# Patient Record
Sex: Male | Born: 2000 | Race: Black or African American | Hispanic: No | Marital: Single
Health system: Southern US, Community
[De-identification: ages and names within clinical notes are randomized; demographics above are authoritative.]

## PROBLEM LIST (undated history)

## (undated) DIAGNOSIS — J302 Other seasonal allergic rhinitis: Secondary | ICD-10-CM

---
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
## 2022-02-20 ENCOUNTER — Other Ambulatory Visit: Payer: Self-pay

## 2022-02-20 ENCOUNTER — Emergency Department (HOSPITAL_COMMUNITY): Payer: Self-pay

## 2022-02-20 ENCOUNTER — Emergency Department (HOSPITAL_COMMUNITY)
Admission: EM | Admit: 2022-02-20 | Discharge: 2022-02-20 | Disposition: A | Discharge status: Home / Self Care | Payer: Self-pay | Attending: Emergency Medicine | Admitting: Emergency Medicine

## 2022-02-20 DIAGNOSIS — N200 Calculus of kidney: Secondary | ICD-10-CM | POA: Insufficient documentation

## 2022-02-20 DIAGNOSIS — R944 Abnormal results of kidney function studies: Secondary | ICD-10-CM | POA: Insufficient documentation

## 2022-02-20 LAB — COMPREHENSIVE METABOLIC PANEL
ALT: 20 U/L (ref 0–44)
AST: 38 U/L (ref 15–41)
Albumin: 4.9 g/dL (ref 3.5–5.0)
Alkaline Phosphatase: 44 U/L (ref 38–126)
Anion gap: 13 (ref 5–15)
BUN: 14 mg/dL (ref 6–20)
CO2: 22 mmol/L (ref 22–32)
Calcium: 9.8 mg/dL (ref 8.9–10.3)
Chloride: 104 mmol/L (ref 98–111)
Creatinine, Ser: 1.46 mg/dL — ABNORMAL HIGH (ref 0.61–1.24)
GFR, Estimated: >60 mL/min (ref >60)
Glucose, Bld: 119 mg/dL — ABNORMAL HIGH (ref 70–99)
Potassium: 4.7 mmol/L (ref 3.5–5.1)
Sodium: 139 mmol/L (ref 135–145)
Total Bilirubin: 1 mg/dL (ref 0.3–1.2)
Total Protein: 8.2 g/dL — ABNORMAL HIGH (ref 6.5–8.1)

## 2022-02-20 LAB — CBC WITH DIFFERENTIAL/PLATELET
Abs Immature Granulocytes: 0.03 10*3/uL (ref 0.00–0.07)
Basophils Absolute: 0 10*3/uL (ref 0.0–0.1)
Basophils Relative: 0 %
Eosinophils Absolute: 0 10*3/uL (ref 0.0–0.5)
Eosinophils Relative: 0 %
HCT: 44 % (ref 39.0–52.0)
Hemoglobin: 14.8 g/dL (ref 13.0–17.0)
Immature Granulocytes: 0 %
Lymphocytes Relative: 8 %
Lymphs Abs: 0.6 10*3/uL — ABNORMAL LOW (ref 0.7–4.0)
MCH: 30.6 pg (ref 26.0–34.0)
MCHC: 33.6 g/dL (ref 30.0–36.0)
MCV: 90.9 fL (ref 80.0–100.0)
Monocytes Absolute: 0.6 10*3/uL (ref 0.1–1.0)
Monocytes Relative: 8 %
Neutro Abs: 6.6 10*3/uL (ref 1.7–7.7)
Neutrophils Relative %: 84 %
Platelets: 129 10*3/uL — ABNORMAL LOW (ref 150–400)
RBC: 4.84 MIL/uL (ref 4.22–5.81)
RDW: 12.8 % (ref 11.5–15.5)
WBC: 7.9 10*3/uL (ref 4.0–10.5)
nRBC: 0 % (ref 0.0–0.2)

## 2022-02-20 LAB — LIPASE, BLOOD: Lipase: 26 U/L (ref 11–51)

## 2022-02-20 MED ORDER — IOHEXOL 350 MG/ML SOLN
75.0000 mL | Freq: Once | INTRAVENOUS | Status: AC | PRN
  Administered 2022-02-20: 75 mL via INTRAVENOUS

## 2022-02-20 MED ORDER — TAMSULOSIN HCL 0.4 MG PO CAPS: 0.4000 mg | ORAL_CAPSULE | Freq: Every day | ORAL | 0 refills | Status: AC

## 2022-02-20 MED ORDER — SODIUM CHLORIDE 0.9 % IV BOLUS
1000.0000 mL | Freq: Once | INTRAVENOUS | Status: AC
  Administered 2022-02-20: 1000 mL via INTRAVENOUS

## 2022-02-20 MED ORDER — ONDANSETRON 4 MG PO TBDP
4.0000 mg | ORAL_TABLET | Freq: Once | ORAL | Status: AC
  Administered 2022-02-20: 4 mg via ORAL
  Filled 2022-02-20: qty 1

## 2022-02-20 MED ORDER — OXYCODONE-ACETAMINOPHEN 5-325 MG PO TABS
2.0000 | ORAL_TABLET | Freq: Once | ORAL | Status: AC
  Administered 2022-02-20: 2 via ORAL
  Filled 2022-02-20: qty 2

## 2022-02-20 NOTE — ED Triage Notes (Signed)
Pt. Stated, I have appendicitis with right side pain and N/V started this morning around 0400

## 2022-02-20 NOTE — ED Provider Notes (Signed)
[facility] EMERGENCY DEPARTMENT Provider Note   CSN: [id number] Arrival date & time: 02/20/22  1040     History  Chief Complaint  Patient presents with   Abdominal Pain   Nausea   Emesis    Robert Cochran is a 21 y.o. male with medical history of seasonal allergies.  Patient presents to ED for evaluation of right lower quadrant abdominal pain.  Patient states that last night he went to bed pain-free.  Patient reports that around 4:45 AM or 5 AM he woke up with right lower quadrant pain rated at 8 out of 10.  Patient reports that initially pain was located in his groin as well but has recently located to his right lower quadrant.  Patient states the pain feels sharp. Patient states that since this time, the right lower quadrant pain progressively worsened at 1 urgent care was 11 out of 10.  On examination currently the patient denies any pain stating that it is decreased.    Patient states he was at urgent care who advised him to present to ED for evaluation, concern for appendicitis.  The patient is endorsing nausea and vomiting this morning however denies any fevers or diarrhea.  Patient denies any urinary symptoms.  The patient states that 2 weeks ago he was hit in the genitals by his dog however the pain from this resolved after about 1 day.   Abdominal Pain Associated symptoms: nausea and vomiting   Associated symptoms: no diarrhea, no dysuria and no fever   Emesis Associated symptoms: abdominal pain   Associated symptoms: no diarrhea and no fever        Home Medications Prior to Admission medications   Medication Sig Start Date End Date Taking? Authorizing Provider  tamsulosin (FLOMAX) 0.4 MG CAPS capsule Take 1 capsule (0.4 mg total) by mouth daily after breakfast. 02/20/22  Yes Azucena Cecil, PA-C  cetirizine (ZYRTEC) 10 MG chewable tablet Chew 1 tablet (10 mg total) by mouth daily. 01/05/14   Melony Overly, MD  doxycycline (VIBRAMYCIN) 100 MG  capsule Take 1 capsule (100 mg total) by mouth 2 (two) times daily. Take as prescribed until finished 11/15/16   Antonietta Breach, PA-C  fluticasone [facility]) 50 MCG/ACT nasal spray Place 2 sprays into both nostrils 2 (two) times daily. Decrease to 2 sprays/nostril daily after 5 days 07/24/13   Liam Graham, PA-C  ibuprofen (ADVIL,MOTRIN) 600 MG tablet Take 1 tablet (600 mg total) by mouth every 6 (six) hours as needed for mild pain or moderate pain. 01/25/17   Kristen Cardinal, NP      Allergies    Penicillins    Review of Systems   Review of Systems  Constitutional:  Negative for fever.  Gastrointestinal:  Positive for abdominal pain, nausea and vomiting. Negative for diarrhea.  Genitourinary:  Positive for flank pain. Negative for dysuria.  All other systems reviewed and are negative.   Physical Exam Updated Vital Signs BP (!) 139/91   Pulse 70   Temp 97.8 F (36.6 C) (Oral)   Resp (!) 21   Ht 6\' 4"  (1.93 m)   Wt 74.8 kg   SpO2 100%   BMI 20.08 kg/m  Physical Exam Vitals and nursing note reviewed.  Constitutional:      General: He is not in acute distress.    Appearance: He is well-developed.  HENT:     Head: Normocephalic and atraumatic.  Eyes:     Conjunctiva/sclera: Conjunctivae normal.  Cardiovascular:  Rate and Rhythm: Normal rate and regular rhythm.     Heart sounds: No murmur heard. Pulmonary:     Effort: Pulmonary effort is normal. No respiratory distress.     Breath sounds: Normal breath sounds.  Abdominal:     Palpations: Abdomen is soft.     Tenderness: There is no abdominal tenderness. There is right CVA tenderness. Negative signs include Murphy's sign, Rovsing's sign and McBurney's sign.  Musculoskeletal:        General: No swelling.     Cervical back: Neck supple.  Skin:    General: Skin is warm and dry.     Capillary Refill: Capillary refill takes less than 2 seconds.  Neurological:     Mental Status: He is alert.  Psychiatric:        Mood and  Affect: Mood normal.     ED Results / Procedures / Treatments   Labs (all labs ordered are listed, but only abnormal results are displayed) Labs Reviewed  CBC WITH DIFFERENTIAL/PLATELET - Abnormal; Notable for the following components:      Result Value   Platelets 129 (*)    Lymphs Abs 0.6 (*)    All other components within normal limits  COMPREHENSIVE METABOLIC PANEL - Abnormal; Notable for the following components:   Glucose, Bld 119 (*)    Creatinine, Ser 1.46 (*)    Total Protein 8.2 (*)    All other components within normal limits  LIPASE, BLOOD  URINALYSIS, ROUTINE W REFLEX MICROSCOPIC    EKG None  Radiology CT ABDOMEN PELVIS W CONTRAST  Result Date: 02/20/2022 CLINICAL DATA:  RLQ abdominal pain EXAM: CT ABDOMEN AND PELVIS WITH CONTRAST TECHNIQUE: Multidetector CT imaging of the abdomen and pelvis was performed using the standard protocol following bolus administration of intravenous contrast. RADIATION DOSE REDUCTION: This exam was performed according to the departmental dose-optimization program which includes automated exposure control, adjustment of the mA and/or kV according to patient size and/or use of iterative reconstruction technique. CONTRAST:  81mL OMNIPAQUE IOHEXOL 350 MG/ML SOLN COMPARISON:  None Available. FINDINGS: Lower chest: No acute abnormality. Hepatobiliary: No focal liver abnormality is seen. Mild periportal edema. No gallstones, gallbladder wall thickening, or biliary dilatation. Pancreas: No pancreatic ductal dilation or evidence of acute inflammation. Spleen: No splenomegaly. Adrenals/Urinary Tract: Bilateral adrenal glands appear normal. Relative subtle hypoenhancement of the right kidney with urothelial hyperenhancement of the right ureter and renal pelvis and perinephric/peripelvic stranding. Left kidney is unremarkable. No hydronephrosis. Urinary bladder wall thickening and subtle urothelial hyperenhancement. Punctate calcification in the proximal  urethra on coronal image 53 and sagittal image 63. Stomach/Bowel: Stomach is unremarkable for degree of distension. No pathologic dilation of small or large bowel. The appendix and terminal ileum appear normal. No evidence of acute bowel inflammation. Vascular/Lymphatic: Normal caliber abdominal aorta. No pathologically enlarged abdominal or pelvic lymph nodes. Reproductive: Prostate is unremarkable. Other: No significant abdominopelvic free fluid. Musculoskeletal: No aggressive lytic or blastic lesion of bone. IMPRESSION: 1. Constellation of findings compatible with sequela of recently passed punctate right ureteral stone which is located in the proximal ureter. No hydronephrosis. Suggest correlation with laboratory values to exclude superimposed infection. 2. Mild periportal edema, which is nonspecific but can be seen in the setting of fluid resuscitation. Electronically Signed   By: Maudry Mayhew M.D.   On: 02/20/2022 14:04    Procedures Procedures   Medications Ordered in ED Medications  oxyCODONE-acetaminophen (PERCOCET/ROXICET) 5-325 MG per tablet 2 tablet (2 tablets Oral Given 02/20/22 1109)  ondansetron (ZOFRAN-ODT) disintegrating tablet 4 mg (4 mg Oral Given 02/20/22 1110)  sodium chloride 0.9 % bolus 1,000 mL (0 mLs Intravenous Stopped 02/20/22 1436)  iohexol (OMNIPAQUE) 350 MG/ML injection 75 mL (75 mLs Intravenous Contrast Given 02/20/22 1348)    ED Course/ Medical Decision Making/ A&P                           Medical Decision Making  21 year old male presents to the ED for evaluation of abdominal pain.  Please see HPI for further details.  On examination the patient is afebrile and nontachycardic.  Patient lung sounds are clear bilaterally, he is not hypoxic.  Patient abdomen soft and compressible however the patient does endorse tenderness to his right lower quadrant.  Patient also with CVA tenderness to right side.  Patient nontoxic in appearance, he appears comfortable and is  not in any pain at present.  Patient workup initiated in triage includes CBC, CMP, lipase, urinalysis, CT abdomen pelvis with contrast.  Patient CBC unremarkable, no leukocytosis or anemia.  Patient CMP with slight creatinine elevation at 1.46 most likely secondary to stone.  Patient lipase unremarkable.  Patient CT abdomen pelvis with contrast shows constellation of findings consistent with a recently passed stone.  The patient has a stone in his urethra.  This is the most likely cause of the patient pain this morning.  Patient provided 1 L fluid for elevated creatinine.  Patient offered pain control medications however deferred these at this time.  At this time, the patient is stable to discharge.  The patient will be sent home with a urine strainer and advised to push fluids.  Patient given return precautions and he voiced understanding.  Patient had all his questions answered to his satisfaction.  The patient is stable for discharge.  Final Clinical Impression(s) / ED Diagnoses Final diagnoses:  Kidney stone    Rx / DC Orders ED Discharge Orders          Ordered    tamsulosin (FLOMAX) 0.4 MG CAPS capsule  Daily after breakfast        02/20/22 1446              Al Decant, PA-C 02/20/22 1446    Cathren Laine, MD 02/21/22 [phone]

## 2022-02-20 NOTE — ED Notes (Signed)
Delay in d/c d/t influx of new pts

## 2022-02-20 NOTE — Discharge Instructions (Addendum)
Return to ED with any new or worsening signs or symptoms such as fevers or inability to urinate Please continue to push fluids at home.  Please attempt to pee every 30 minutes. Please take tamsulosin once daily until stone is passed Please utilize provided strainer Please read attached guides concerning kidney stones as well as dietary guidelines to prevent kidney stones If in the event you are able to pass stone, please follow-up with urology and use the number provided to make an appointment

## 2022-02-20 NOTE — ED Provider Triage Note (Signed)
Emergency Medicine Provider Triage Evaluation Note  Jayan Raymundo , a 21 y.o. male  was evaluated in triage.  Pt complains of abd pain. Acute onset of RLQ pain started today.  Endorse nausea/vomiting.  Seen at [facility] and sent here with concerns of appy.  Denies fever, dysuria.  Review of Systems  Positive: As above Negative: As above  Physical Exam  BP 136/83 (BP Location: Right Arm)   Pulse (!) 55   Temp 97.8 F (36.6 C) (Oral)   Resp 17   Ht 6\' 4"  (1.93 m)   Wt 74.8 kg   SpO2 100%   BMI 20.08 kg/m  Gen:   Awake, no distress   Resp:  Normal effort  MSK:   Moves extremities without difficulty  Other:    Medical Decision Making  Medically screening exam initiated at 11:05 AM.  Appropriate orders placed.  Marque Rademaker was informed that the remainder of the evaluation will be completed by another provider, this initial triage assessment does not replace that evaluation, and the importance of remaining in the ED until their evaluation is complete.     Darnelle Bos, PA-C 02/20/22 1106

## 2023-11-19 ENCOUNTER — Encounter (HOSPITAL_COMMUNITY): Payer: Self-pay

## 2023-11-19 ENCOUNTER — Other Ambulatory Visit: Payer: Self-pay

## 2023-11-19 ENCOUNTER — Emergency Department (HOSPITAL_COMMUNITY)
Admission: EM | Admit: 2023-11-19 | Discharge: 2023-11-19 | Disposition: A | Discharge status: Home / Self Care | Attending: Emergency Medicine | Admitting: Emergency Medicine

## 2023-11-19 ENCOUNTER — Emergency Department (HOSPITAL_COMMUNITY)

## 2023-11-19 DIAGNOSIS — T148XXA Other injury of unspecified body region, initial encounter: Secondary | ICD-10-CM | POA: Diagnosis not present

## 2023-11-19 DIAGNOSIS — R202 Paresthesia of skin: Secondary | ICD-10-CM | POA: Diagnosis not present

## 2023-11-19 DIAGNOSIS — S5002XA Contusion of left elbow, initial encounter: Secondary | ICD-10-CM | POA: Diagnosis not present

## 2023-11-19 DIAGNOSIS — Y92410 Unspecified street and highway as the place of occurrence of the external cause: Secondary | ICD-10-CM | POA: Insufficient documentation

## 2023-11-19 DIAGNOSIS — T07XXXA Unspecified multiple injuries, initial encounter: Secondary | ICD-10-CM

## 2023-11-19 DIAGNOSIS — S59902A Unspecified injury of left elbow, initial encounter: Secondary | ICD-10-CM | POA: Diagnosis present

## 2023-11-19 MED ORDER — METHOCARBAMOL 500 MG PO TABS: 500.0000 mg | ORAL_TABLET | Freq: Three times a day (TID) | ORAL | 0 refills | Status: AC | PRN
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
